# Patient Record
Sex: Female | Born: 1975 | Race: White | Hispanic: No | Marital: Married
Health system: Southern US, Community
[De-identification: ages and names within clinical notes are randomized; demographics above are authoritative.]

---

## 2006-12-14 ENCOUNTER — Encounter: Payer: Self-pay | Admitting: Maternal & Fetal Medicine

## 2007-03-23 ENCOUNTER — Ambulatory Visit: Payer: Self-pay

## 2007-04-26 ENCOUNTER — Encounter: Payer: Self-pay | Admitting: Maternal & Fetal Medicine

## 2007-06-18 ENCOUNTER — Encounter: Payer: Self-pay | Admitting: Maternal & Fetal Medicine

## 2007-12-13 ENCOUNTER — Encounter: Payer: Self-pay | Admitting: Obstetrics and Gynecology

## 2008-01-07 ENCOUNTER — Encounter: Payer: Self-pay | Admitting: Maternal and Fetal Medicine

## 2008-02-12 ENCOUNTER — Encounter: Payer: Self-pay | Admitting: Pediatric Cardiology

## 2008-06-08 ENCOUNTER — Inpatient Hospital Stay: Payer: Self-pay

## 2011-05-15 ENCOUNTER — Ambulatory Visit: Payer: Self-pay | Admitting: Family Medicine

## 2011-11-09 ENCOUNTER — Ambulatory Visit: Payer: Self-pay | Admitting: Family Medicine

## 2012-04-14 ENCOUNTER — Ambulatory Visit: Payer: Self-pay | Admitting: Emergency Medicine

## 2012-04-14 LAB — URINALYSIS, COMPLETE
Bacteria: NEGATIVE
Glucose,UR: NEGATIVE mg/dL (ref 0–75)
Ketone: NEGATIVE
RBC,UR: NONE SEEN /HPF (ref 0–5)

## 2012-04-16 LAB — URINE CULTURE

## 2012-04-27 ENCOUNTER — Ambulatory Visit: Payer: Self-pay | Admitting: General Surgery

## 2012-04-27 LAB — COMPREHENSIVE METABOLIC PANEL
Albumin: 4.4 g/dL (ref 3.4–5.0)
Anion Gap: 7 (ref 7–16)
BUN: 13 mg/dL (ref 7–18)
Bilirubin,Total: 0.3 mg/dL (ref 0.2–1.0)
Calcium, Total: 9.1 mg/dL (ref 8.5–10.1)
Chloride: 107 mmol/L (ref 98–107)
Co2: 25 mmol/L (ref 21–32)
Glucose: 96 mg/dL (ref 65–99)
Osmolality: 278 (ref 275–301)
Sodium: 139 mmol/L (ref 136–145)
Total Protein: 8.8 g/dL — ABNORMAL HIGH (ref 6.4–8.2)

## 2012-04-27 LAB — CBC WITH DIFFERENTIAL/PLATELET
Basophil #: 0 10*3/uL (ref 0.0–0.1)
Eosinophil %: 0 %
HGB: 14.3 g/dL (ref 12.0–16.0)
Lymphocyte #: 0.8 10*3/uL — ABNORMAL LOW (ref 1.0–3.6)
MCV: 88 fL (ref 80–100)
Neutrophil #: 4.9 10*3/uL (ref 1.4–6.5)

## 2012-04-28 LAB — CEA: CEA: 0.8 ng/mL (ref 0.0–4.7)

## 2012-04-30 ENCOUNTER — Ambulatory Visit: Payer: Self-pay | Admitting: General Surgery

## 2012-05-01 ENCOUNTER — Other Ambulatory Visit: Payer: Self-pay | Admitting: General Surgery

## 2012-05-01 DIAGNOSIS — C50911 Malignant neoplasm of unspecified site of right female breast: Secondary | ICD-10-CM

## 2012-05-03 ENCOUNTER — Ambulatory Visit
Admission: RE | Admit: 2012-05-03 | Discharge: 2012-05-03 | Disposition: A | Discharge status: Home / Self Care | Payer: Managed Care, Other (non HMO) | Source: Ambulatory Visit | Attending: General Surgery | Admitting: General Surgery

## 2012-05-03 DIAGNOSIS — C50911 Malignant neoplasm of unspecified site of right female breast: Secondary | ICD-10-CM

## 2012-05-03 MED ORDER — GADOBENATE DIMEGLUMINE 529 MG/ML IV SOLN
15.0000 mL | Freq: Once | INTRAVENOUS | Status: AC | PRN
  Administered 2012-05-03: 15 mL via INTRAVENOUS

## 2012-05-11 ENCOUNTER — Ambulatory Visit: Payer: Self-pay | Admitting: General Surgery

## 2012-05-11 ENCOUNTER — Ambulatory Visit: Payer: Self-pay | Admitting: Oncology

## 2012-05-14 ENCOUNTER — Ambulatory Visit: Payer: Self-pay | Admitting: Hematology and Oncology

## 2012-05-26 ENCOUNTER — Ambulatory Visit: Payer: Self-pay | Admitting: Oncology

## 2012-05-26 ENCOUNTER — Ambulatory Visit: Payer: Self-pay | Admitting: Hematology and Oncology

## 2012-09-24 ENCOUNTER — Ambulatory Visit: Payer: Self-pay | Admitting: General Surgery

## 2012-09-26 ENCOUNTER — Ambulatory Visit: Payer: Self-pay | Admitting: General Surgery

## 2012-10-16 ENCOUNTER — Encounter: Payer: Self-pay | Admitting: *Deleted

## 2014-07-18 NOTE — Op Note (Signed)
PATIENT NAME:  Jeanette Arnold, Jeanette Arnold MR#:  [id number] DATE OF BIRTH:  01/27/1976  DATE OF PROCEDURE:  05/11/2012  PREOPERATIVE DIAGNOSIS: Right breast cancer.   POSTOPERATIVE DIAGNOSIS: Right breast cancer.  OPERATIVE PROCEDURE: Right axillary sentinel node biopsy, modified radical mastectomy.   OPERATING SURGEON: Robert Bellow, MD.   ANESTHESIA: General by LMA under Dr. [place] Service.   ESTIMATED BLOOD LOSS: 50 mL.  FLUID REPLACEMENT: Crystalloid.   CLINICAL NOTE: This 39 year old woman was recently diagnosed with a centrally located breast tumor. Preoperative MRI showed a second satellite lesion inferior to the primary. She was felt to be best managed by mastectomy with delayed reconstruction. She was injected with technetium sulfur colloid prior to the procedure.   OPERATIVE NOTE: With the patient under adequate general anesthesia, the breast was prepped with ChloraPrep and draped. A total of 6 mL of methylene blue diluted 1:2 with normal saline was injected in the subareolar plexus. A modified laterally-based tennis racquet incision was used around the areola with a slight medial extension to minimize the "dog ear" skin sparing technique was utilized for planned later reconstruction. Due to the central location of the tumor it was elected to have the primary incision about 5 cm outside the edge of the areola.   A gamma finder was used to show an area of increased uptake in the mid axilla. A small 2 cm skin line incision was made here and carried down through the skin and subcutaneous tissue with hemostasis achieved by electrocautery. A hot blue node and two small hot, but non-blue, nodes were identified. 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 The skin was incised sharply and the remaining dissection completed with electrocautery. Hemostasis was with 3-0 Vicryl ties as needed. The skin flaps were elevated to the sternum medially, to the clavicle  superiorly, to the inframammary fold inferiorly and serratus muscle laterally. The breast was elevated off the underlying pectoralis muscle with the fascia of that muscle taken with the specimen. The axillary envelope was entered and an axillary dissection completed. The borders of dissection were the axillary vein superiorly, the chest wall medially and the thoracodorsal nerve, artery and vein posteriorly. Both the thoracodorsal nerve, artery and vein and the long thoracic nerve of Bell were functional at the end of the procedure. The intercostal brachial nerve was preserved. The area was irrigated with water. Good hemostasis was noted. A single 15-French JP drain was brought out through an incision in the inferior medial flap and anchored in place with 2-0 nylon. The axillary incision was closed with 3-0 Vicryl figure-of-eight sutures in the adipose layer and a running 4-0 Vicryl subcuticular suture for the skin. The skin of the mastectomy site was approximated with a running 2-0 Vicryl in the deep dermal layer. Benzoin and Steri-Strips were applied. A Telfa pad was placed and fluffed gauze, Kerlix and Ace wrap dressing applied.   The patient tolerated the procedure well and was taken to the recovery room in stable condition.   ____________________________ Robert Bellow, MD jwb:jm D: 05/11/2012 15:59:04 ET T: 05/12/2012 10:02:35 ET JOB#: [id number]  cc: Robert Bellow, MD, <Dictator> Kerin Perna, MD Cleda Daub, MD Antasia Haider Amedeo Kinsman MD ELECTRONICALLY SIGNED 05/12/2012 11:19

## 2015-02-02 IMAGING — NM NUCLEAR MEDICINE WHOLE BODY BONE SCINTIGRAPHY
2 series · 3 of 3 positions shown · non-contrast
Comparison: none

REASON FOR EXAM: RT arm pain   breast CA
COMMENTS:

[Series 1000: 3 hr wholebody · 2.40mm/px · 2 of 2 frames shown]
[frame 1/2]
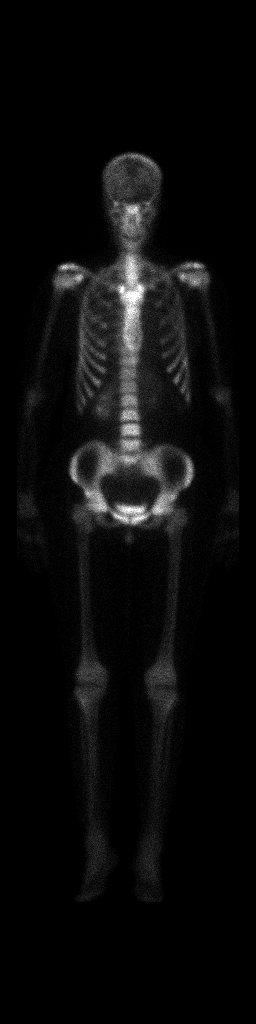
[frame 2/2]
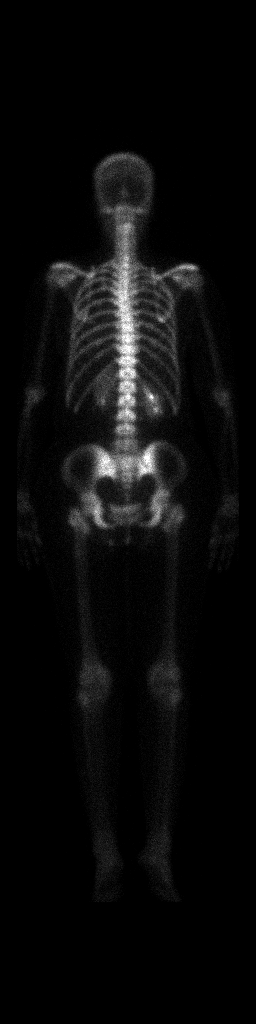

[Series 1000: bone save_screens · 1 of 1 slices shown]
[im 1/1]
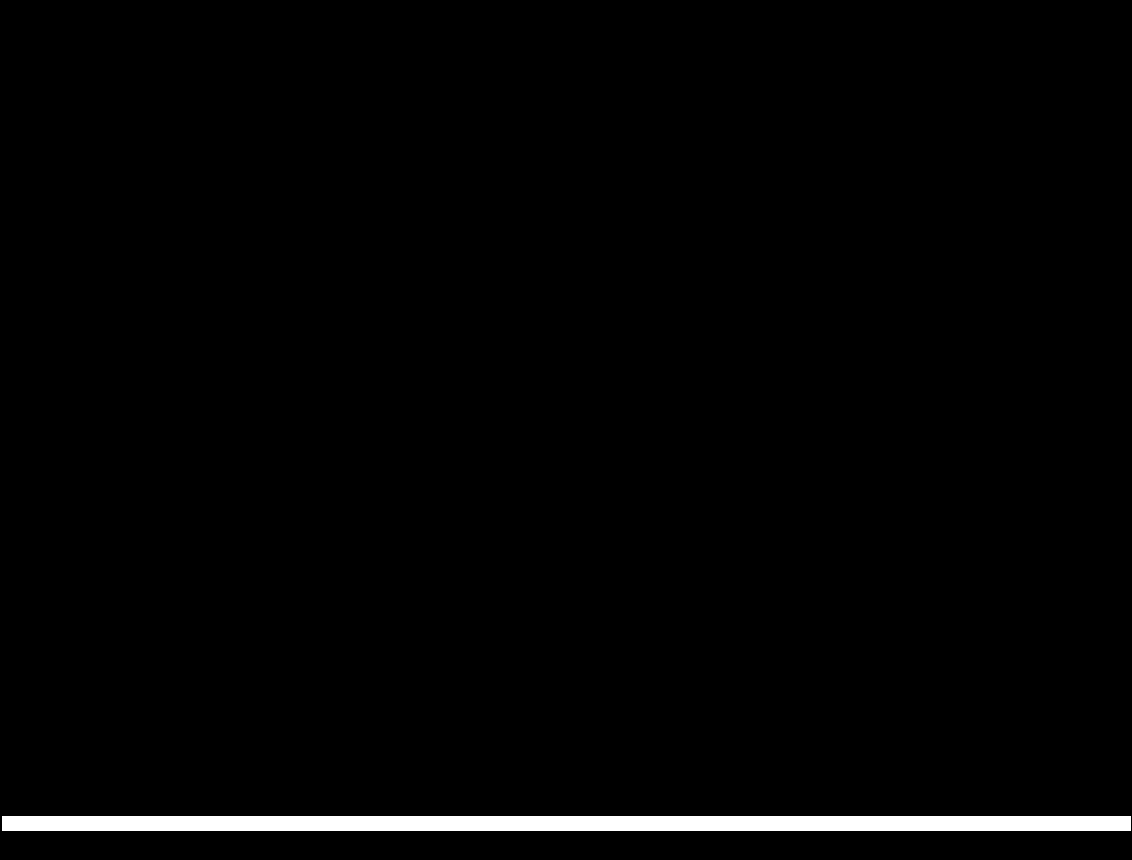

[3 of 3 positions shown; findings below may reference images not displayed]

PROCEDURE:     KNM - KNM BONE WB 3HR [DATE]  [DATE]

RESULT:     The patient has a history of breast malignancy and is
experiencing right arm discomfort. The patient received 23.27 mCi of
technetium 99 image correction 99 and labeled MDP. There is adequate uptake
of the radio pharmaceutical by the skeleton. There is adequate soft tissue
clearance renal activity.

Adequate uptake of the radiopharmaceutical over the skeletal over the is
skull, spine, pectoral girdle, and pelvic girdle is normal. There is a small
amount of activity associated with the kidneys and urinary bladder.
IMPRESSION: There are no findings to suggest metastatic disease to the
skeleton.

[REDACTED]
